# Patient Record
Sex: Female | Born: 1995 | Hispanic: Yes | Marital: Single | Smoking: Never smoker
Health system: Southern US, Community
[De-identification: ages and names within clinical notes are randomized; demographics above are authoritative.]

---
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
## 2017-04-06 ENCOUNTER — Other Ambulatory Visit: Payer: Self-pay

## 2017-04-06 ENCOUNTER — Ambulatory Visit
Admission: EM | Admit: 2017-04-06 | Discharge: 2017-04-06 | Disposition: A | Discharge status: Home / Self Care | Payer: BLUE CROSS/BLUE SHIELD | Attending: Family Medicine | Admitting: Family Medicine

## 2017-04-06 DIAGNOSIS — S060X0A Concussion without loss of consciousness, initial encounter: Secondary | ICD-10-CM | POA: Diagnosis not present

## 2017-04-06 DIAGNOSIS — S0181XA Laceration without foreign body of other part of head, initial encounter: Secondary | ICD-10-CM

## 2017-04-06 DIAGNOSIS — W228XXA Striking against or struck by other objects, initial encounter: Secondary | ICD-10-CM
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
## 2017-04-06 NOTE — Discharge Instructions (Signed)
Bacitracin ointment twice a day Tylenol or advil as needed for pain

## 2019-09-21 ENCOUNTER — Emergency Department
Admission: EM | Admit: 2019-09-21 | Discharge: 2019-09-21 | Disposition: A | Discharge status: Home / Self Care | Payer: BC Managed Care – PPO | Attending: Emergency Medicine | Admitting: Emergency Medicine

## 2019-09-21 ENCOUNTER — Other Ambulatory Visit: Payer: Self-pay

## 2019-09-21 ENCOUNTER — Emergency Department: Payer: BC Managed Care – PPO

## 2019-09-21 DIAGNOSIS — R079 Chest pain, unspecified: Secondary | ICD-10-CM | POA: Diagnosis present

## 2019-09-21 LAB — CBC
HCT: 39.4 % (ref 36.0–46.0)
Hemoglobin: 14.3 g/dL (ref 12.0–15.0)
MCH: 30.8 pg (ref 26.0–34.0)
MCHC: 36.3 g/dL — ABNORMAL HIGH (ref 30.0–36.0)
MCV: 84.7 fL (ref 80.0–100.0)
Platelets: 330 10*3/uL (ref 150–400)
RBC: 4.65 MIL/uL (ref 3.87–5.11)
RDW: 12.4 % (ref 11.5–15.5)
WBC: 6.9 10*3/uL (ref 4.0–10.5)
nRBC: 0 % (ref 0.0–0.2)

## 2019-09-21 LAB — TROPONIN I (HIGH SENSITIVITY): Troponin I (High Sensitivity): <2 ng/L (ref <18)

## 2019-09-21 LAB — BASIC METABOLIC PANEL
Anion gap: 12 (ref 5–15)
BUN: 15 mg/dL (ref 6–20)
CO2: 20 mmol/L — ABNORMAL LOW (ref 22–32)
Calcium: 9.3 mg/dL (ref 8.9–10.3)
Chloride: 108 mmol/L (ref 98–111)
Creatinine, Ser: 0.71 mg/dL (ref 0.44–1.00)
GFR calc Af Amer: >60 mL/min (ref >60)
GFR calc non Af Amer: >60 mL/min (ref >60)
Glucose, Bld: 108 mg/dL — ABNORMAL HIGH (ref 70–99)
Potassium: 3.3 mmol/L — ABNORMAL LOW (ref 3.5–5.1)
Sodium: 140 mmol/L (ref 135–145)

## 2019-09-21 MED ORDER — SODIUM CHLORIDE 0.9% FLUSH: 3.0000 mL | Freq: Once | INTRAVENOUS | Status: DC

## 2019-09-21 MED ORDER — HYDROXYZINE HCL 25 MG PO TABS: 25.0000 mg | ORAL_TABLET | Freq: Three times a day (TID) | ORAL | 0 refills | Status: AC | PRN

## 2019-09-21 MED ORDER — HYDROXYZINE HCL 25 MG PO TABS
50.0000 mg | ORAL_TABLET | Freq: Once | ORAL | Status: AC
  Administered 2019-09-21: 50 mg via ORAL
  Filled 2019-09-21: qty 2

## 2019-09-21 NOTE — ED Notes (Signed)
Pt states she feels better than initial assessment. Pt assisted to bathroom.

## 2019-09-21 NOTE — ED Notes (Signed)
Pt taken to xray 
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
## 2021-10-10 IMAGING — CR DG CHEST 2V
2 series · 2 of 2 positions shown · non-contrast
Comparison: No priors.

CLINICAL DATA: 24-year-old female with history of chest pain.

EXAM:
CHEST - 2 VIEW

[chest pa]
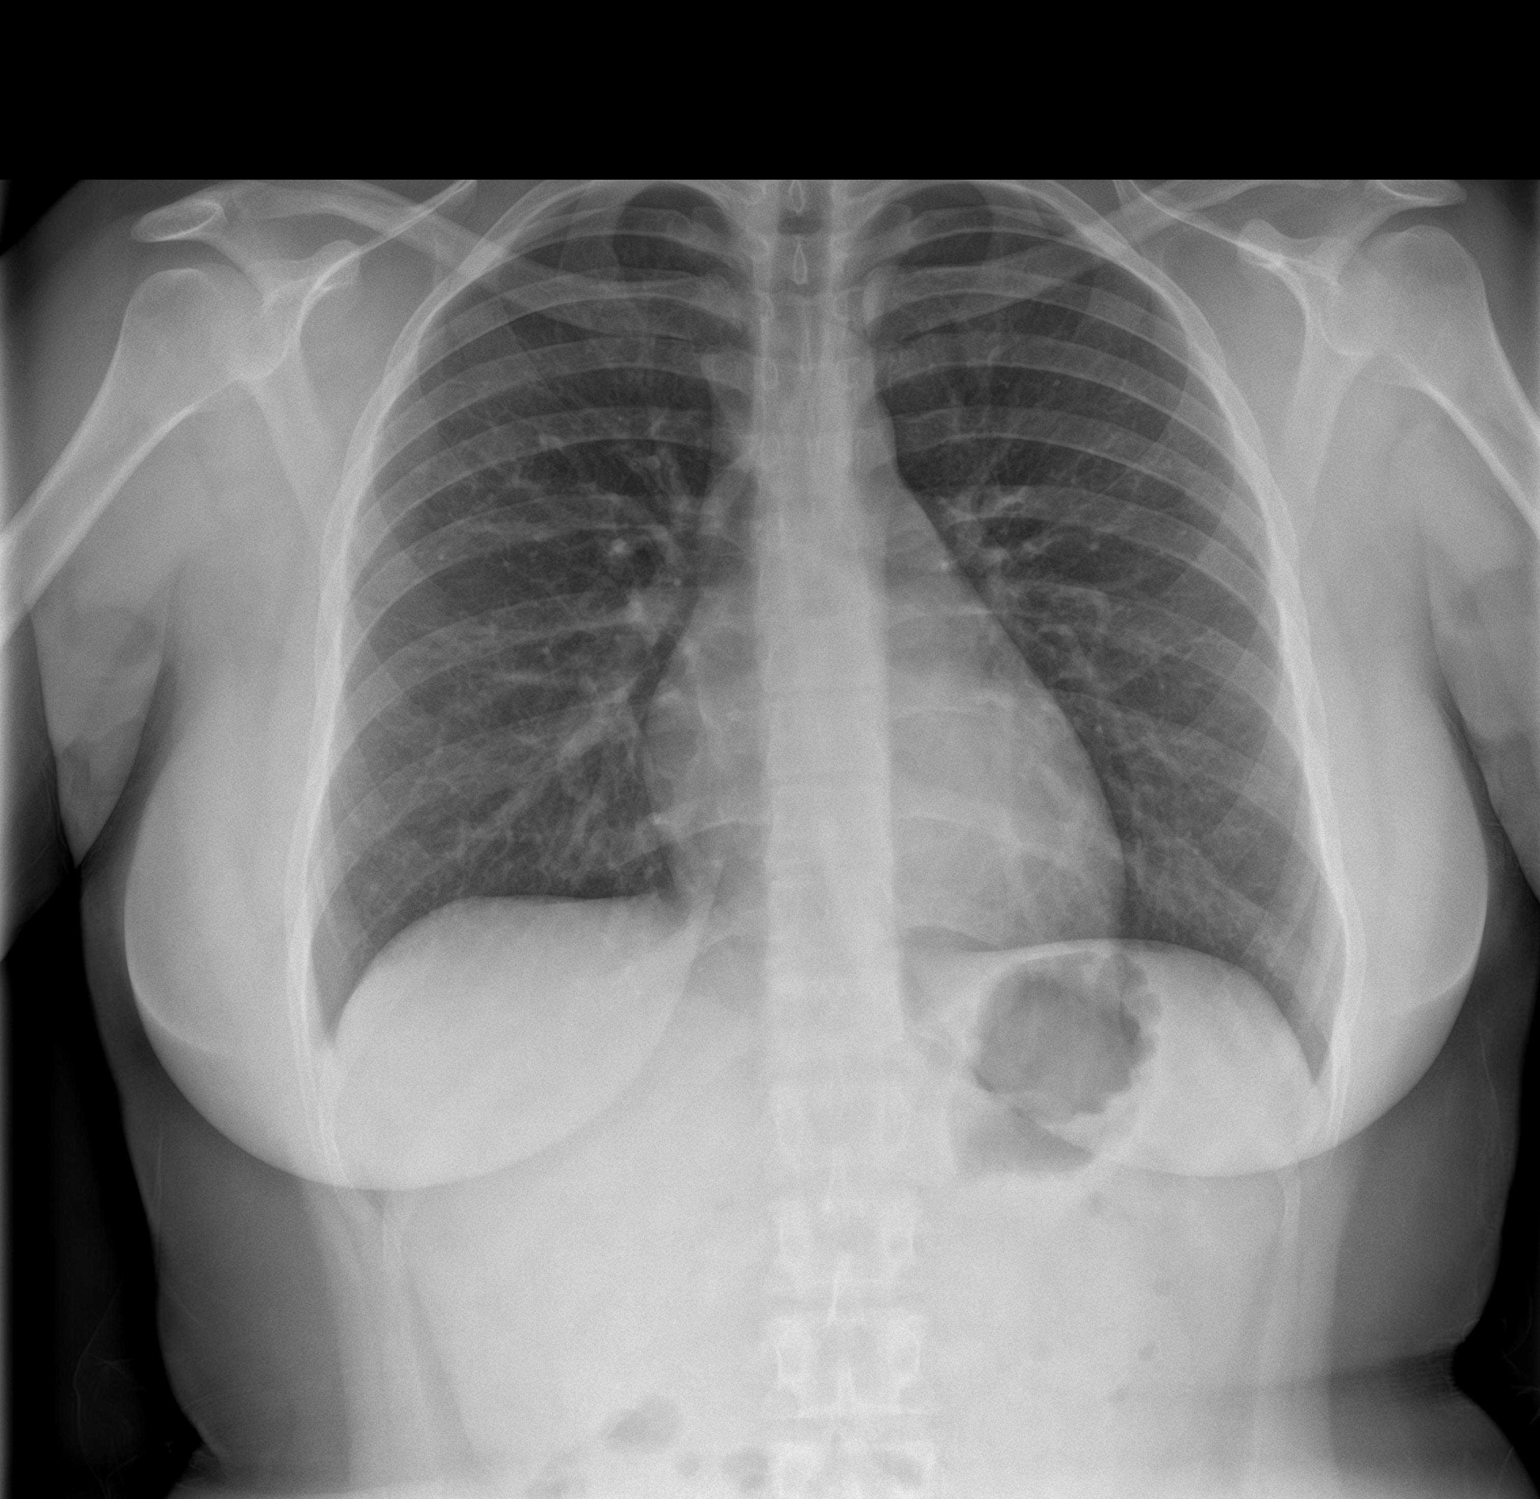

[chest lat]
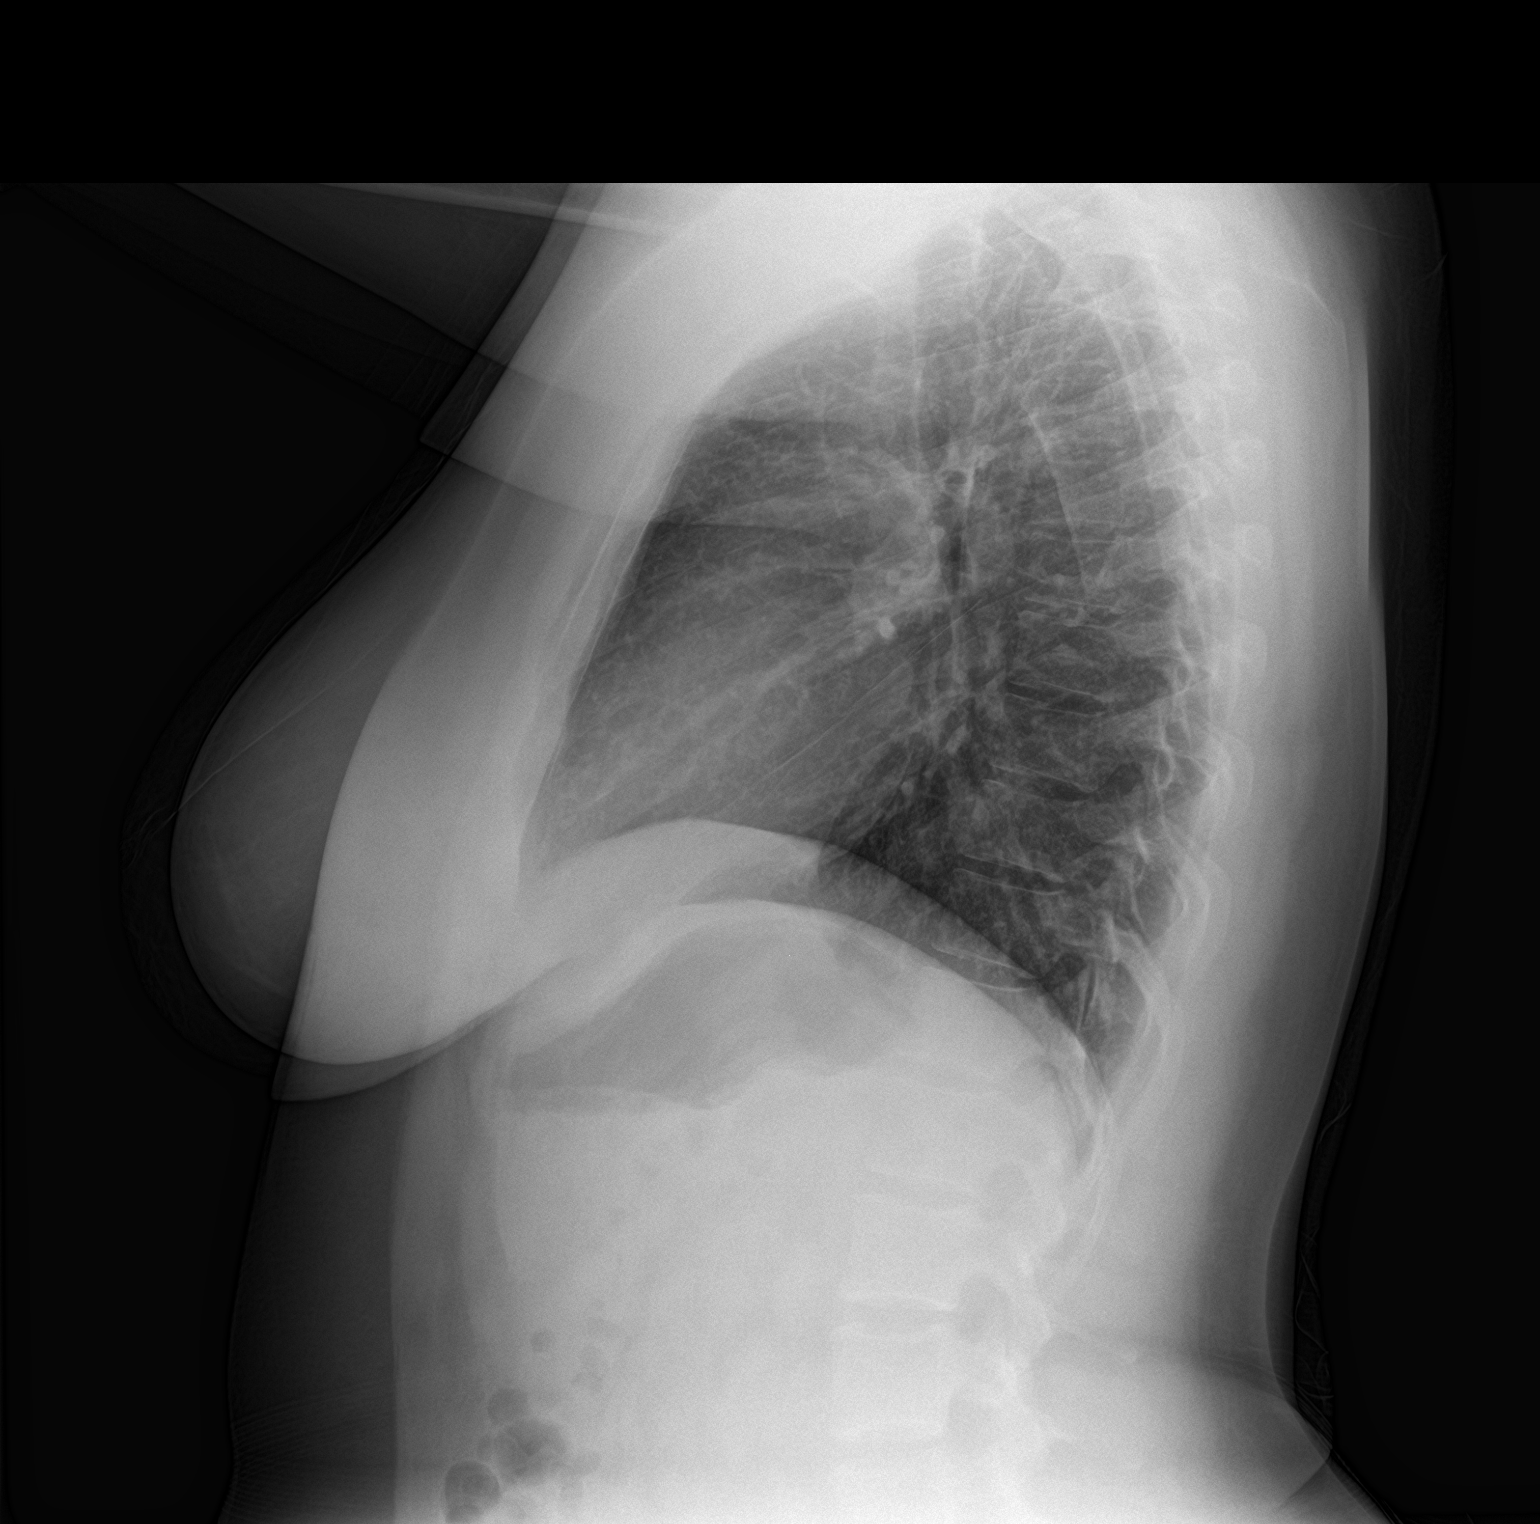

[2 of 2 positions shown; findings below may reference images not displayed]

FINDINGS: Lung volumes are normal. No consolidative airspace disease. No
pleural effusions. No pneumothorax. No pulmonary nodule or mass
noted. Pulmonary vasculature and the cardiomediastinal silhouette
are within normal limits.
IMPRESSION: No radiographic evidence of acute cardiopulmonary disease.

## 2022-08-02 ENCOUNTER — Ambulatory Visit: Payer: Managed Care, Other (non HMO) | Admitting: Obstetrics

## 2022-08-02 ENCOUNTER — Encounter: Payer: Self-pay | Admitting: Obstetrics

## 2022-08-02 VITALS — BP 122/77 | HR 94 | Ht 59.0 in | Wt 177.5 lb

## 2022-08-02 DIAGNOSIS — N923 Ovulation bleeding: Secondary | ICD-10-CM | POA: Diagnosis not present

## 2022-08-02 DIAGNOSIS — N926 Irregular menstruation, unspecified: Secondary | ICD-10-CM | POA: Diagnosis not present

## 2022-08-02 DIAGNOSIS — Z3202 Encounter for pregnancy test, result negative: Secondary | ICD-10-CM | POA: Diagnosis not present

## 2022-08-02 LAB — POCT URINE PREGNANCY: Preg Test, Ur: NEGATIVE

## 2022-08-02 NOTE — Progress Notes (Signed)
Chief Complaint  Patient presents with   Establish Care   Menstrual Problem    HPI:      [name] is a 27 y.o. [id number] whose LMP was Patient's last menstrual period was 06/23/2022., presents today for irregular menses. Has been tracking cycles in phone app, usually every 28-30d, but since January has been irregular. Did not have cycle at all in April, last cycle was 06/23/22, started spotting last night. Took home UPT about a month ago and it was negative. Pap last month which was negative. Began exercising in January, walking 3x/week at the gym. No dietary changes. 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 Condoms for contraception & is happy with this method    There are no problems to display for this patient.   No past surgical history on file.  No family history on file.  Social History   Socioeconomic History   Marital status: Single    Spouse name: Not on file   Number of children: Not on file   Years of education: Not on file   Highest education level: Not on file  Occupational History   Not on file  Tobacco Use   Smoking status: Never   Smokeless tobacco: Never  Substance and Sexual Activity   Alcohol use: No   Drug use: No   Sexual activity: Not on file  Other Topics Concern   Not on file  Social History Narrative   Not on file   Social Determinants of Health   Financial Resource Strain: Not on file  Food Insecurity: Not on file  Transportation Needs: Not on file  Physical Activity: Not on file  Stress: Not on file  Social Connections: Not on file  Intimate Partner Violence: Not on file    Outpatient Medications Prior to Visit  Medication Sig Dispense Refill   hydrOXYzine (ATARAX/VISTARIL) 25 MG tablet Take 1 tablet (25 mg total) by mouth 3 (three) times daily as needed for anxiety. 30 tablet 0   No facility-administered medications prior to visit.       ROS:  Review of Systems  Constitutional: Negative.   Respiratory: Negative.    Cardiovascular: Negative.   Genitourinary:  Positive for menstrual problem.  Neurological: Negative.   Psychiatric/Behavioral: Negative.        OBJECTIVE:   Vitals:  BP 122/77   Pulse 94   Ht 4\' 11"  (1.499 m)   Wt 177 lb 8 oz (80.5 kg)   LMP 06/23/2022   BMI 35.85 kg/m   Physical Exam Vitals reviewed.  Constitutional:      Appearance: Normal appearance.  Cardiovascular:     Rate and Rhythm: Normal rate.  Genitourinary:    General: Normal vulva.     Cervix: No cervical motion tenderness.     Uterus: Normal.      Adnexa:        Right: No tenderness.         Left: No tenderness.       Comments: Uterus smooth, mobile, nontender, retroverted. Neurological:     Mental Status: She is alert.     Results: No results found for this or any previous visit (from the past 24 hour(s)).   Assessment/Plan: Irregular menses - Plan: TSH, POCT urine pregnancy, CANCELED: Prolactin  Spotting between menses - Plan: TSH, POCT urine pregnancy, CANCELED: Prolactin    No orders of the defined types  were placed in this encounter.  UPT in clinic negative. TSH drawn. Reviewed differential diagnoses including increased stress, lifestyle changes impacting cycle regularity vs possible hormonal imbalance. Desires to start with TSH, will consider further hormonal testing if irregularity continues.   No follow-ups on file.  Dominica Severin, CNM 08/02/2022

## 2022-08-03 LAB — TSH: TSH: 1.77 u[IU]/mL (ref 0.450–4.500)

## 2022-08-11 ENCOUNTER — Encounter: Payer: Self-pay | Admitting: Obstetrics
